# Patient Record
Sex: Male | Born: 1960 | Race: White | Hispanic: No | Marital: Married | Smoking: Current some day smoker
Health system: Southern US, Community
[De-identification: ages and names within clinical notes are randomized; demographics above are authoritative.]

## PROBLEM LIST (undated history)

## (undated) DIAGNOSIS — F419 Anxiety disorder, unspecified: Secondary | ICD-10-CM

## (undated) DIAGNOSIS — T7840XA Allergy, unspecified, initial encounter: Secondary | ICD-10-CM

## (undated) DIAGNOSIS — E785 Hyperlipidemia, unspecified: Secondary | ICD-10-CM

## (undated) DIAGNOSIS — K219 Gastro-esophageal reflux disease without esophagitis: Secondary | ICD-10-CM

## (undated) HISTORY — PX: SINUS EXPLORATION: SHX5214

## (undated) HISTORY — DX: Allergy, unspecified, initial encounter: T78.40XA

---

## 2001-05-11 ENCOUNTER — Encounter: Payer: Self-pay | Admitting: Emergency Medicine

## 2001-05-11 ENCOUNTER — Emergency Department (HOSPITAL_COMMUNITY): Admission: EM | Admit: 2001-05-11 | Discharge: 2001-05-11 | Payer: Self-pay | Admitting: Emergency Medicine

## 2001-09-26 ENCOUNTER — Emergency Department (HOSPITAL_COMMUNITY): Admission: EM | Admit: 2001-09-26 | Discharge: 2001-09-26 | Payer: Self-pay | Admitting: Emergency Medicine

## 2001-09-30 ENCOUNTER — Emergency Department (HOSPITAL_COMMUNITY): Admission: EM | Admit: 2001-09-30 | Discharge: 2001-09-30 | Payer: Self-pay | Admitting: Emergency Medicine

## 2001-09-30 ENCOUNTER — Encounter: Payer: Self-pay | Admitting: Emergency Medicine

## 2001-10-25 ENCOUNTER — Encounter: Payer: Self-pay | Admitting: Emergency Medicine

## 2001-10-25 ENCOUNTER — Emergency Department (HOSPITAL_COMMUNITY): Admission: EM | Admit: 2001-10-25 | Discharge: 2001-10-25 | Payer: Self-pay | Admitting: Emergency Medicine

## 2002-06-21 ENCOUNTER — Emergency Department (HOSPITAL_COMMUNITY): Admission: EM | Admit: 2002-06-21 | Discharge: 2002-06-21 | Payer: Self-pay | Admitting: Emergency Medicine

## 2002-06-21 ENCOUNTER — Encounter: Payer: Self-pay | Admitting: Emergency Medicine

## 2002-12-29 ENCOUNTER — Emergency Department (HOSPITAL_COMMUNITY): Admission: EM | Admit: 2002-12-29 | Discharge: 2002-12-29 | Payer: Self-pay | Admitting: Emergency Medicine

## 2002-12-29 ENCOUNTER — Encounter: Payer: Self-pay | Admitting: Emergency Medicine

## 2003-02-20 ENCOUNTER — Emergency Department (HOSPITAL_COMMUNITY): Admission: EM | Admit: 2003-02-20 | Discharge: 2003-02-20 | Payer: Self-pay | Admitting: Unknown Physician Specialty

## 2003-03-28 ENCOUNTER — Encounter: Payer: Self-pay | Admitting: Emergency Medicine

## 2003-03-28 ENCOUNTER — Emergency Department (HOSPITAL_COMMUNITY): Admission: EM | Admit: 2003-03-28 | Discharge: 2003-03-28 | Payer: Self-pay | Admitting: Emergency Medicine

## 2003-07-06 ENCOUNTER — Emergency Department (HOSPITAL_COMMUNITY): Admission: EM | Admit: 2003-07-06 | Discharge: 2003-07-06 | Payer: Self-pay | Admitting: Emergency Medicine

## 2003-07-12 ENCOUNTER — Emergency Department (HOSPITAL_COMMUNITY): Admission: EM | Admit: 2003-07-12 | Discharge: 2003-07-12 | Payer: Self-pay | Admitting: Emergency Medicine

## 2003-07-12 ENCOUNTER — Encounter: Payer: Self-pay | Admitting: Emergency Medicine

## 2003-07-31 ENCOUNTER — Emergency Department (HOSPITAL_COMMUNITY): Admission: EM | Admit: 2003-07-31 | Discharge: 2003-07-31 | Payer: Self-pay | Admitting: Emergency Medicine

## 2004-07-21 ENCOUNTER — Emergency Department (HOSPITAL_COMMUNITY): Admission: EM | Admit: 2004-07-21 | Discharge: 2004-07-21 | Payer: Self-pay | Admitting: Emergency Medicine

## 2004-08-02 ENCOUNTER — Emergency Department (HOSPITAL_COMMUNITY): Admission: EM | Admit: 2004-08-02 | Discharge: 2004-08-02 | Payer: Self-pay | Admitting: Emergency Medicine

## 2004-08-03 ENCOUNTER — Emergency Department (HOSPITAL_COMMUNITY): Admission: EM | Admit: 2004-08-03 | Discharge: 2004-08-03 | Payer: Self-pay | Admitting: Emergency Medicine

## 2004-08-20 ENCOUNTER — Emergency Department (HOSPITAL_COMMUNITY): Admission: EM | Admit: 2004-08-20 | Discharge: 2004-08-20 | Payer: Self-pay | Admitting: Emergency Medicine

## 2005-01-09 ENCOUNTER — Emergency Department (HOSPITAL_COMMUNITY): Admission: EM | Admit: 2005-01-09 | Discharge: 2005-01-09 | Payer: Self-pay | Admitting: Emergency Medicine

## 2006-01-26 ENCOUNTER — Emergency Department (HOSPITAL_COMMUNITY): Admission: EM | Admit: 2006-01-26 | Discharge: 2006-01-26 | Payer: Self-pay | Admitting: Emergency Medicine

## 2006-08-20 ENCOUNTER — Emergency Department (HOSPITAL_COMMUNITY): Admission: EM | Admit: 2006-08-20 | Discharge: 2006-08-20 | Payer: Self-pay | Admitting: Emergency Medicine

## 2006-12-08 HISTORY — PX: NASAL SINUS SURGERY: SHX719

## 2011-01-20 ENCOUNTER — Ambulatory Visit (INDEPENDENT_AMBULATORY_CARE_PROVIDER_SITE_OTHER): Payer: BC Managed Care – PPO | Admitting: Family Medicine

## 2011-01-20 DIAGNOSIS — J309 Allergic rhinitis, unspecified: Secondary | ICD-10-CM

## 2011-01-20 DIAGNOSIS — M549 Dorsalgia, unspecified: Secondary | ICD-10-CM

## 2011-01-20 DIAGNOSIS — M79609 Pain in unspecified limb: Secondary | ICD-10-CM

## 2011-01-20 DIAGNOSIS — Z0000 Encounter for general adult medical examination without abnormal findings: Secondary | ICD-10-CM

## 2011-02-18 ENCOUNTER — Ambulatory Visit: Payer: BC Managed Care – PPO | Admitting: Family Medicine

## 2011-05-13 ENCOUNTER — Other Ambulatory Visit: Payer: Self-pay | Admitting: Internal Medicine

## 2011-05-22 ENCOUNTER — Other Ambulatory Visit: Payer: BC Managed Care – PPO

## 2012-03-29 ENCOUNTER — Emergency Department (HOSPITAL_COMMUNITY)
Admission: EM | Admit: 2012-03-29 | Discharge: 2012-03-29 | Disposition: A | Discharge status: Home / Self Care | Payer: No Typology Code available for payment source | Attending: Emergency Medicine | Admitting: Emergency Medicine

## 2012-03-29 ENCOUNTER — Encounter (HOSPITAL_COMMUNITY): Payer: Self-pay

## 2012-03-29 DIAGNOSIS — T148XXA Other injury of unspecified body region, initial encounter: Secondary | ICD-10-CM

## 2012-03-29 DIAGNOSIS — R079 Chest pain, unspecified: Secondary | ICD-10-CM | POA: Insufficient documentation

## 2012-03-29 DIAGNOSIS — M542 Cervicalgia: Secondary | ICD-10-CM | POA: Insufficient documentation
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
## 2012-03-30 ENCOUNTER — Encounter: Payer: Self-pay | Admitting: *Deleted

## 2012-03-31 ENCOUNTER — Ambulatory Visit: Payer: BC Managed Care – PPO

## 2012-03-31 ENCOUNTER — Ambulatory Visit (INDEPENDENT_AMBULATORY_CARE_PROVIDER_SITE_OTHER): Payer: BC Managed Care – PPO | Admitting: Family Medicine

## 2012-03-31 DIAGNOSIS — R0789 Other chest pain: Secondary | ICD-10-CM

## 2012-03-31 DIAGNOSIS — S139XXA Sprain of joints and ligaments of unspecified parts of neck, initial encounter: Secondary | ICD-10-CM

## 2012-03-31 DIAGNOSIS — R071 Chest pain on breathing: Secondary | ICD-10-CM

## 2012-03-31 DIAGNOSIS — S161XXA Strain of muscle, fascia and tendon at neck level, initial encounter: Secondary | ICD-10-CM

## 2012-03-31 MED ORDER — CYCLOBENZAPRINE HCL 5 MG PO TABS: 5.0000 mg | ORAL_TABLET | Freq: Three times a day (TID) | ORAL | Status: AC | PRN

## 2012-03-31 NOTE — ED Provider Notes (Signed)
History/physical exam/procedure(s) were performed by non-physician practitioner and as supervising physician I was immediately available for consultation/collaboration. I have reviewed all notes and am in agreement with care and plan.   Keean Wilmeth S Ashelynn Marks, MD 03/31/12 1540 
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
## 2012-04-01 ENCOUNTER — Telehealth: Payer: Self-pay

## 2012-04-01 NOTE — Telephone Encounter (Signed)
Per Dr. Hal Hope, ok for light duty note. Please fill out.  Cody Quinn

## 2012-04-01 NOTE — Telephone Encounter (Signed)
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  But would like to know if he can get out of  work note for 4/29 until 5/5 with restrictions. Was seen yesturday and forgot to get a work note.

## 2012-04-01 NOTE — Telephone Encounter (Signed)
.  [facility] The patient's wife called regarding work note for light duty for date range of 04/05/12 through 04/11/12.  Please call patient at [phone] or patient's wife Sam Overbeck at [phone].  The patient was seen on 03/31/12 by Dr. Hal Hope.

## 2012-04-01 NOTE — Telephone Encounter (Signed)
Spoke w/pt and wife to verify what work pt normally does and details of dates needed. Per Dr Hal Hope wrote and faxed Return to Work Eval form for pt which Dr Hal Hope reviewed. Received confirmation fax went through. Mailed pt copy of note per request.

## 2012-04-04 ENCOUNTER — Telehealth: Payer: Self-pay

## 2012-04-04 NOTE — Telephone Encounter (Signed)
Patient called again and wants someone to please call her!  Needs a note for work for husband tomorrow so he wont get fired.  Very upset, not understanding and perhaps confused.

## 2012-04-04 NOTE — Telephone Encounter (Signed)
SPOUSE CALLED VERY UPSET.  SHE STATES THAT RECORDS HAVE NOT BEEN RECEIVED FROM VISIT WITH DR RICHTER ON 03/31/12.  NEEDS RECORDS FAXED TO [phone] ATTN:SARAH OR LEON.  WIFE WAS IMPAIRED AND ADAMANT THAT HE BE ON LIGHT DUTY AND NO HEAVY LIFTING.  REPEATEDLY TOLD EVENTS AND STATED SHE WANTED DR. RICHTER TO CALL HER TONIGHT.  EXPLAINED DR. RICHTER NOT IN UNTIL TOMORROW, BUT THAT I WOULD SUBMIT MESSAGE.
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
## 2012-04-04 NOTE — Telephone Encounter (Signed)
Patient evaluated Wednesday and called back the following day requesting a note for light duty.  [employer] completed light duty note and submitted to patient's employer. Per Barbara's documentation fax confirmation was received and is on file.

## 2012-04-05 ENCOUNTER — Emergency Department (HOSPITAL_COMMUNITY): Payer: No Typology Code available for payment source

## 2012-04-05 ENCOUNTER — Emergency Department (HOSPITAL_COMMUNITY)
Admission: EM | Admit: 2012-04-05 | Discharge: 2012-04-05 | Disposition: A | Discharge status: Home / Self Care | Payer: No Typology Code available for payment source | Attending: Emergency Medicine | Admitting: Emergency Medicine

## 2012-04-05 ENCOUNTER — Encounter (HOSPITAL_COMMUNITY): Payer: Self-pay | Admitting: Emergency Medicine

## 2012-04-05 DIAGNOSIS — M542 Cervicalgia: Secondary | ICD-10-CM | POA: Insufficient documentation

## 2012-04-05 DIAGNOSIS — R51 Headache: Secondary | ICD-10-CM | POA: Insufficient documentation

## 2012-04-05 DIAGNOSIS — F411 Generalized anxiety disorder: Secondary | ICD-10-CM | POA: Insufficient documentation

## 2012-04-05 DIAGNOSIS — M79609 Pain in unspecified limb: Secondary | ICD-10-CM | POA: Insufficient documentation

## 2012-04-05 DIAGNOSIS — K219 Gastro-esophageal reflux disease without esophagitis: Secondary | ICD-10-CM | POA: Insufficient documentation

## 2012-04-05 DIAGNOSIS — E785 Hyperlipidemia, unspecified: Secondary | ICD-10-CM | POA: Insufficient documentation

## 2012-04-05 HISTORY — DX: Hyperlipidemia, unspecified: E78.5

## 2012-04-05 HISTORY — DX: Anxiety disorder, unspecified: F41.9

## 2012-04-05 HISTORY — DX: Gastro-esophageal reflux disease without esophagitis: K21.9
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
## 2012-04-06 NOTE — ED Provider Notes (Signed)
Medical screening examination/treatment/procedure(s) were performed by non-physician practitioner and as supervising physician I was immediately available for consultation/collaboration.   Saory Carriero, MD 04/06/12 0011 

## 2012-04-07 DIAGNOSIS — Z0271 Encounter for disability determination: Secondary | ICD-10-CM

## 2012-04-10 ENCOUNTER — Ambulatory Visit (INDEPENDENT_AMBULATORY_CARE_PROVIDER_SITE_OTHER): Payer: BC Managed Care – PPO | Admitting: Family Medicine

## 2012-04-10 VITALS — BP 113/76 | HR 57 | Temp 98.4°F | Resp 16 | Ht 66.75 in | Wt 164.2 lb

## 2012-04-10 DIAGNOSIS — J329 Chronic sinusitis, unspecified: Secondary | ICD-10-CM

## 2012-04-10 DIAGNOSIS — IMO0001 Reserved for inherently not codable concepts without codable children: Secondary | ICD-10-CM

## 2012-04-10 DIAGNOSIS — J309 Allergic rhinitis, unspecified: Secondary | ICD-10-CM

## 2012-04-10 DIAGNOSIS — M62838 Other muscle spasm: Secondary | ICD-10-CM

## 2012-04-10 MED ORDER — AZITHROMYCIN 250 MG PO TABS: ORAL_TABLET | ORAL | Status: AC
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
## 2012-04-12 ENCOUNTER — Telehealth: Payer: Self-pay

## 2012-04-12 LAB — RUBEOLA ANTIBODY IGG: Rubeola IgG: 0.47 {ISR}

## 2012-04-12 MED ORDER — AZELASTINE HCL 0.1 % NA SOLN: 1.0000 | Freq: Two times a day (BID) | NASAL | Status: DC

## 2012-04-12 NOTE — Telephone Encounter (Signed)
Astelin NS prescribed. Patient is not immune to measles. Please ask him to RTC for MMR vaccination.

## 2012-04-12 NOTE — Telephone Encounter (Signed)
Spoke with patient and informed Astelin Rx sent to pharmacy.  Also, not immune to Measles.  Please come in for MMR vaccine.  Patient concerned exposed to measles in waiting room of hospital.  Assured patient that he would be showing signs by now (per Dr Hal Hope) but if concerned symptoms are high fever, red spots.  Patient says not showing these signs.  Patient will come in for immunization in next few days.

## 2012-04-12 NOTE — Progress Notes (Signed)
Addended by: Dois Davenport on: 04/12/2012 04:54 PM   Modules accepted: Orders

## 2012-04-12 NOTE — Telephone Encounter (Signed)
Pt called from pharmacy and stated that only the Z pack is there. He was pretty certain that Dr Hal Hope had said she was going to RX another nasal spray to use instead of or in addition to his Flonase. He thinks the name started w/an "A". Dr Hal Hope, did you want to Rx for pt? Pt states he uses [facility] on [address]

## 2012-04-12 NOTE — Telephone Encounter (Signed)
Dr. Hal Hope. Can you also review the pt's labs. Thanks

## 2012-04-13 ENCOUNTER — Ambulatory Visit (INDEPENDENT_AMBULATORY_CARE_PROVIDER_SITE_OTHER): Payer: BC Managed Care – PPO | Admitting: Physician Assistant

## 2012-04-13 VITALS — BP 116/70 | HR 65 | Temp 98.2°F | Resp 16

## 2012-04-13 DIAGNOSIS — Z23 Encounter for immunization: Secondary | ICD-10-CM

## 2012-04-13 NOTE — Progress Notes (Signed)
Cody Quinn comes in today for MMR after having negative titer 04/10/12. Ok to give this today

## 2012-04-21 ENCOUNTER — Other Ambulatory Visit: Payer: Self-pay | Admitting: Internal Medicine

## 2012-04-21 DIAGNOSIS — R196 Halitosis: Secondary | ICD-10-CM

## 2012-04-30 ENCOUNTER — Ambulatory Visit
Admission: RE | Admit: 2012-04-30 | Discharge: 2012-04-30 | Disposition: A | Discharge status: Home / Self Care | Payer: BC Managed Care – PPO | Source: Ambulatory Visit | Attending: Internal Medicine | Admitting: Internal Medicine

## 2012-04-30 DIAGNOSIS — R196 Halitosis: Secondary | ICD-10-CM

## 2012-06-07 DIAGNOSIS — Z0271 Encounter for disability determination: Secondary | ICD-10-CM

## 2012-06-17 ENCOUNTER — Telehealth: Payer: Self-pay

## 2012-06-17 ENCOUNTER — Ambulatory Visit (INDEPENDENT_AMBULATORY_CARE_PROVIDER_SITE_OTHER): Payer: BC Managed Care – PPO | Admitting: Internal Medicine

## 2012-06-17 DIAGNOSIS — Z043 Encounter for examination and observation following other accident: Secondary | ICD-10-CM

## 2012-06-17 DIAGNOSIS — M545 Low back pain: Secondary | ICD-10-CM

## 2012-06-17 MED ORDER — METHYLPREDNISOLONE ACETATE 80 MG/ML IJ SUSP
80.0000 mg | Freq: Once | INTRAMUSCULAR | Status: AC
  Administered 2012-06-17: 80 mg via INTRAMUSCULAR

## 2012-06-17 NOTE — Telephone Encounter (Signed)
Dr Perrin Maltese, Medical Records do not have a disc for this patient.
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
## 2012-06-25 ENCOUNTER — Ambulatory Visit (INDEPENDENT_AMBULATORY_CARE_PROVIDER_SITE_OTHER): Payer: BC Managed Care – PPO | Admitting: Internal Medicine

## 2012-06-25 DIAGNOSIS — M545 Low back pain, unspecified: Secondary | ICD-10-CM

## 2012-06-25 DIAGNOSIS — S335XXA Sprain of ligaments of lumbar spine, initial encounter: Secondary | ICD-10-CM

## 2012-06-25 DIAGNOSIS — S39012A Strain of muscle, fascia and tendon of lower back, initial encounter: Secondary | ICD-10-CM
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
## 2012-07-01 ENCOUNTER — Encounter: Payer: Self-pay | Admitting: Internal Medicine

## 2012-09-16 ENCOUNTER — Encounter: Payer: Self-pay | Admitting: *Deleted

## 2012-11-01 ENCOUNTER — Ambulatory Visit
Admission: RE | Admit: 2012-11-01 | Discharge: 2012-11-01 | Disposition: A | Discharge status: Home / Self Care | Payer: BC Managed Care – PPO | Source: Ambulatory Visit | Attending: Internal Medicine | Admitting: Internal Medicine

## 2012-11-01 ENCOUNTER — Other Ambulatory Visit: Payer: Self-pay | Admitting: Internal Medicine

## 2012-11-01 DIAGNOSIS — R140 Abdominal distension (gaseous): Secondary | ICD-10-CM

## 2012-11-01 DIAGNOSIS — R109 Unspecified abdominal pain: Secondary | ICD-10-CM

## 2013-01-08 DIAGNOSIS — Z0271 Encounter for disability determination: Secondary | ICD-10-CM

## 2013-01-25 ENCOUNTER — Other Ambulatory Visit: Payer: Self-pay | Admitting: Gastroenterology

## 2013-01-25 DIAGNOSIS — R109 Unspecified abdominal pain: Secondary | ICD-10-CM

## 2013-01-26 ENCOUNTER — Other Ambulatory Visit: Payer: BC Managed Care – PPO
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
## 2014-01-12 ENCOUNTER — Telehealth: Payer: Self-pay

## 2014-01-12 ENCOUNTER — Encounter: Payer: Self-pay | Admitting: *Deleted

## 2014-01-12 NOTE — Telephone Encounter (Signed)
Mailed copy of MMR  Vaccine documentation to patient,.

## 2014-01-12 NOTE — Telephone Encounter (Signed)
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 PLEASE CALL [phone]

## 2014-12-19 ENCOUNTER — Ambulatory Visit: Payer: BC Managed Care – PPO | Admitting: Podiatry

## 2014-12-26 ENCOUNTER — Ambulatory Visit (INDEPENDENT_AMBULATORY_CARE_PROVIDER_SITE_OTHER): Payer: PRIVATE HEALTH INSURANCE | Admitting: Podiatry

## 2014-12-26 ENCOUNTER — Encounter: Payer: Self-pay | Admitting: Podiatry

## 2014-12-26 ENCOUNTER — Ambulatory Visit (INDEPENDENT_AMBULATORY_CARE_PROVIDER_SITE_OTHER): Payer: PRIVATE HEALTH INSURANCE

## 2014-12-26 VITALS — BP 113/74 | HR 60 | Resp 12

## 2014-12-26 DIAGNOSIS — R52 Pain, unspecified: Secondary | ICD-10-CM

## 2014-12-26 DIAGNOSIS — M722 Plantar fascial fibromatosis: Secondary | ICD-10-CM

## 2014-12-26 NOTE — Progress Notes (Signed)
   Subjective:    Patient ID: Cody Quinn, male    DOB: March 07, 1961, 54 y.o.   MRN: [id number]  HPI PT STATED RT ARCH AND BALL OF THE FOOT HAVE KNOT AND BEEN PAINFUL FOR 3 MONTHS. THE FOOT IS GETTING WORSE AND GET AGGRAVATED BY WALKING BAREFOOT. TRIED SOAK WITH EPSON SALT, HEAT BUT HELP SOME.   Review of Systems  Constitutional: Positive for activity change.  HENT: Positive for ear pain, sinus pressure and sore throat.   Cardiovascular: Positive for leg swelling.  Musculoskeletal: Positive for myalgias, back pain, joint swelling and gait problem.  All other systems reviewed and are negative.      Objective:   Physical Exam: I have reviewed his past medical history medications allergy surgery social history and review of systems. Pulses are strongly palpable bilateral. Neurologic sensorium is intact percent as well as the monofilament. Deep tendon reflexes are intact bilateral and muscle strength is 5 over 5 dorsiflexion plantar flexors inverters and everters all digits of musculature is intact. Orthopedic evaluation of his straight all joints distal to the ankle have a full range of motion without crepitus. A firm nonpulsatile nodule is noted to the plantar medial band of the plantar fascia and the central medial longitudinal arch. Radiographically this is not visible. There is no signs of calcification. He does however demonstrate osteoarthritis of the first metatarsophalangeal joint with dorsal spurring and joint space narrowing.        Assessment & Plan:  Assessment: Plantar fibroma right medial longitudinal arch.  Plan: Injected the area today with Kenalog and local anesthetic and I will follow-up with him in 2 months.

## 2015-03-01 ENCOUNTER — Ambulatory Visit: Payer: PRIVATE HEALTH INSURANCE | Admitting: Podiatry

## 2015-03-29 ENCOUNTER — Ambulatory Visit (INDEPENDENT_AMBULATORY_CARE_PROVIDER_SITE_OTHER): Payer: PRIVATE HEALTH INSURANCE | Admitting: Podiatry

## 2015-03-29 ENCOUNTER — Encounter: Payer: Self-pay | Admitting: Podiatry

## 2015-03-29 VITALS — BP 113/59 | HR 61 | Resp 16

## 2015-03-29 DIAGNOSIS — M205X1 Other deformities of toe(s) (acquired), right foot: Secondary | ICD-10-CM

## 2015-03-29 DIAGNOSIS — L608 Other nail disorders: Secondary | ICD-10-CM | POA: Diagnosis not present

## 2015-03-29 DIAGNOSIS — L603 Nail dystrophy: Secondary | ICD-10-CM

## 2015-03-29 MED ORDER — DICLOFENAC SODIUM 1 % TD GEL: 4.0000 g | Freq: Four times a day (QID) | TRANSDERMAL | Status: DC

## 2015-03-29 NOTE — Progress Notes (Signed)
He presents today for follow-up of his first metatarsophalangeal joint capsulitis and osteoarthritis of the first metatarsophalangeal joint as well as the second toe right foot. He states that the last injection really didn't seem to help too much that he finds relief from Aleve arthritis.  Objective: Vital signs are stable alert and oriented 3. Pulses are palpable pain on palpation metatarsophalangeal joint and range of motion first metatarsophalangeal joint. He also has thick dystrophic possibly mycotic nails.  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  Plan: Symptoms of the nail were taken today for pathologic evaluation and we discussed the need for surgical intervention regarding the right foot. We prescribed diclofenac gel.

## 2015-05-02 ENCOUNTER — Telehealth: Payer: Self-pay | Admitting: *Deleted

## 2015-05-02 NOTE — Telephone Encounter (Addendum)
Left message requesting call back to inform of test results.  Dr. Al CorpusHyatt states 03/29/2015 fungal culture is negative, pt may choose to treat toenails with Nuvail, if so prescribe as use as directed and refill prn 1 year.  Pt's wife, Lupita LeashDonna called stating the lab results had not been call to them.  I left her a message to have her husband call our office for results.  I informed pt's wife the toenails fungal culture is negative and offered Nuvail.  Pt 's wife states order the NuVail.  Done.

## 2015-05-03 MED ORDER — NUVAIL EX SOLN: 1.0000 [drp] | Freq: Every day | CUTANEOUS | Status: DC

## 2015-05-10 ENCOUNTER — Institutional Professional Consult (permissible substitution): Payer: Self-pay | Admitting: Pulmonary Disease

## 2015-05-31 ENCOUNTER — Telehealth: Payer: Self-pay | Admitting: *Deleted

## 2015-05-31 NOTE — Telephone Encounter (Addendum)
[facility] faxed request for prior authorization for NuVail 16% solution.  I called pt's insurance [id number] and was transferred to pharmacy department, left a message with pt information and [facility] office contact and fax information.  Pt's insurance will not cover Nuvail, and representative is discussing with the pharmacist a covered alternative for treatment of toenail deformity WITHOUT FUNGAL INFECTION.  Pharmacist states Ciclopirox was a covered product.  Bako fungal results were negative for fungus.  Dr. Al Corpus states if not fungal then there is no alternative medication.  Left message requesting a return call.  Left message requesting a return call about medication.  Left message requesting callback about medications.

## 2015-06-07 NOTE — Telephone Encounter (Signed)
Not fungal. If not covered no alternative.

## 2015-06-28 NOTE — Telephone Encounter (Signed)
Pt's wife, Lupita Leash states pt's plantar fasciitis isn't in the arch anymore , but more in the heel, Aleve caused GI BLEEDING, and the orthotics from the [employer] aren't helping and cost over $400.00. I encouraged Lupita Leash to have pt make an appt, to discuss other therapies including injections, plantar fascial brace, and rx orthotics which are often covered by most insurances for varying amounts. Lupita Leash states she will discuss with pt and call for an appt, if he agrees.  Lupita Leash states pt is using FungiNail and seeing improvement.

## 2015-09-13 ENCOUNTER — Ambulatory Visit (INDEPENDENT_AMBULATORY_CARE_PROVIDER_SITE_OTHER): Payer: PRIVATE HEALTH INSURANCE | Admitting: Podiatry

## 2015-09-13 ENCOUNTER — Encounter: Payer: Self-pay | Admitting: Podiatry

## 2015-09-13 VITALS — BP 105/66 | HR 60 | Resp 16

## 2015-09-13 DIAGNOSIS — M722 Plantar fascial fibromatosis: Secondary | ICD-10-CM

## 2015-09-13 DIAGNOSIS — M205X1 Other deformities of toe(s) (acquired), right foot: Secondary | ICD-10-CM

## 2015-09-13 DIAGNOSIS — M7751 Other enthesopathy of right foot: Secondary | ICD-10-CM | POA: Diagnosis not present

## 2015-09-13 MED ORDER — DICLOFENAC SODIUM 1 % TD GEL: 4.0000 g | Freq: Four times a day (QID) | TRANSDERMAL | Status: AC

## 2015-09-13 NOTE — Progress Notes (Signed)
He presents today with his wife chief complaint of pain to his right ankle which we have discussed in the past. Also complaining about pain about the first and second metatarsophalangeal joint as well as the right heel. All these we have had discussions on in the past. She also states that the diclofenac gel was working very well Her refill on that.  Objective: Vital signs stable alert and oriented 3. Pulses are strongly palpable. Pain on palpation medial calcaneal tubercle of the right heel. Some tenderness on range of motion and palpation of the ankle joint itself very nonspecific no swelling no edema no erythema cellulitis drainage or odor. It is not warm to the touch. He has osteoarthritic changes of the head of the first metatarsophalangeal joint and second digit right foot. Consistent with hallux limitus and hammertoe deformity with arthritis area  Assessment: Capsulitis of the right ankle plantar fasciitis of the right heel. 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  Plan: Discussed etiology pathology conservative versus surgical therapies placed him in a Tri-Lock brace injected his right heel today with Kenalog and local anesthesia and refilled the prescription for diclofenac gel.  Arbutus Ped DPM

## 2015-11-08 ENCOUNTER — Ambulatory Visit: Payer: PRIVATE HEALTH INSURANCE | Admitting: Podiatry

## 2015-11-15 ENCOUNTER — Ambulatory Visit: Payer: PRIVATE HEALTH INSURANCE | Admitting: Podiatry

## 2015-11-27 ENCOUNTER — Encounter: Payer: Self-pay | Admitting: Podiatry

## 2015-11-27 ENCOUNTER — Ambulatory Visit (INDEPENDENT_AMBULATORY_CARE_PROVIDER_SITE_OTHER): Payer: PRIVATE HEALTH INSURANCE | Admitting: Podiatry

## 2015-11-27 VITALS — BP 115/78 | HR 60 | Resp 12

## 2015-11-27 DIAGNOSIS — M7751 Other enthesopathy of right foot: Secondary | ICD-10-CM

## 2015-11-27 DIAGNOSIS — M722 Plantar fascial fibromatosis: Secondary | ICD-10-CM | POA: Diagnosis not present

## 2015-11-27 NOTE — Progress Notes (Signed)
He presents again today for follow-up of his right heel pain and pain to the first metatarsophalangeal joint. He states that he really has not resolved much at all. He continues to take pain medication provided by his chronic pain doctor.  Objective: Vital signs are stable he is alert and oriented 3. Pulses are palpable. His pain on palpation and range of motion of the first metatarsophalangeal joint of the right foot and some decreased in pain on palpation of the right heel at the plantar fascial calcaneal insertion site.  Assessment: Plantar fasciitis right heel. 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  Plan: Discussed etiology and pathology conservative versus surgical therapies. We discussed the need for surgery first metatarsophalangeal joint and an endoscopic fasciotomy of the right heel. At this point we will perform a surgical consult on his next visit. I reinjected his right heel today with Kenalog and local anesthetic and dexamethasone to the first metatarsophalangeal joint 2 mg. This is after sterile Betadine skin prep. Follow-up with him in the next few weeks for surgical consult. We will make sure to contact his pain management doctor for change in medication.

## 2015-11-29 ENCOUNTER — Ambulatory Visit: Payer: PRIVATE HEALTH INSURANCE | Admitting: Podiatry

## 2015-12-25 ENCOUNTER — Encounter: Payer: Self-pay | Admitting: Podiatry

## 2015-12-25 ENCOUNTER — Ambulatory Visit (INDEPENDENT_AMBULATORY_CARE_PROVIDER_SITE_OTHER): Payer: PRIVATE HEALTH INSURANCE | Admitting: Podiatry

## 2015-12-25 DIAGNOSIS — M722 Plantar fascial fibromatosis: Secondary | ICD-10-CM | POA: Diagnosis not present

## 2015-12-25 DIAGNOSIS — M7751 Other enthesopathy of right foot: Secondary | ICD-10-CM

## 2015-12-25 DIAGNOSIS — M205X1 Other deformities of toe(s) (acquired), right foot: Secondary | ICD-10-CM

## 2015-12-25 NOTE — Patient Instructions (Signed)
Pre-Operative Instructions  Congratulations, you have decided to take an important step to improving your quality of life.  You can be assured that the doctors of [facility] will be with you every step of the way.  1. Plan to be at the surgery center/hospital at least 1 (one) hour prior to your scheduled time unless otherwise directed by the surgical center/hospital staff.  You must have a responsible adult accompany you, remain during the surgery and drive you home.  Make sure you have directions to the surgical center/hospital and know how to get there on time. 2. For hospital based surgery you will need to obtain a history and physical form from your family physician within 1 month prior to the date of surgery- we will give you a form for you primary physician.  3. We make every effort to accommodate the date you request for surgery.  There are however, times where surgery dates or times have to be moved.  We will contact you as soon as possible if a change in schedule is required.   4. No Aspirin/Ibuprofen for one week before surgery.  If you are on aspirin, any non-steroidal anti-inflammatory medications (Mobic, Aleve, Ibuprofen) you should stop taking it 7 days prior to your surgery.  You make take Tylenol  For pain prior to surgery.  5. Medications- If you are taking daily heart and blood pressure medications, seizure, reflux, allergy, asthma, anxiety, pain or diabetes medications, make sure the surgery center/hospital is aware before the day of surgery so they may notify you which medications to take or avoid the day of surgery. 6. No food or drink after midnight the night before surgery unless directed otherwise by surgical center/hospital staff. 7. No alcoholic beverages 24 hours prior to surgery.  No smoking 24 hours prior to or 24 hours after surgery. 8. Wear loose pants or shorts- loose enough to fit over bandages, boots, and casts. 9. No slip on shoes, sneakers are best. 10. Bring  your boot with you to the surgery center/hospital.  Also bring crutches or a walker if your physician has prescribed it for you.  If you do not have this equipment, it will be provided for you after surgery. 11. If you have not been contracted by the surgery center/hospital by the day before your surgery, call to confirm the date and time of your surgery. 12. Leave-time from work may vary depending on the type of surgery you have.  Appropriate arrangements should be made prior to surgery with your employer. 13. Prescriptions will be provided immediately following surgery by your doctor.  Have these filled as soon as possible after surgery and take the medication as directed. 14. Remove nail polish on the operative foot. 15. Wash the night before surgery.  The night before surgery wash the foot and leg well with the antibacterial soap provided and water paying special attention to beneath the toenails and in between the toes.  Rinse thoroughly with water and dry well with a towel.  Perform this wash unless told not to do so by your physician.  Enclosed: 1 Ice pack (please put in freezer the night before surgery)   1 Hibiclens skin cleaner   Pre-op Instructions  If you have any questions regarding the instructions, do not hesitate to call our office.  [place]: [address] [place], [place] [address] [phone]  [place]: [address] [place], [place] [address] [phone]  [place]: [address]  [place], [place] [address] [phone]  Dr. Richard   Tuchman DPM, Dr. Norman Regal DPM Dr. Richard Sikora DPM, Dr. M. Todd Hyatt DPM, Dr. Kathryn Egerton DPM 

## 2015-12-25 NOTE — Progress Notes (Signed)
He presents today with his wife with a chief complaint of chronic pain in his right heel and first metatarsophalangeal joint. He states that really just does not seem to be getting any better and he would like to entertain a surgical correction. I discussed with him in great detail today his past medical history medications allergy surgeries and social history. Currently he is being treated by his primary care provider with Narco for chronic back pain. Otherwise he is in good health with no complications.  Objective: Vital signs are stable he is alert and oriented 3. I have reviewed his past medical history medications allergy surgeries. Allergies consist of codeine and Lipitor. Pulses are strongly palpable bilaterally neurologic sensorium is intact. He has pain on palpation chronic in nature right heel. This is noted to be at the plantar fascial calcaneal insertion site. He also has limitation in range of motion of the first metatarsophalangeal joint right foot. We have discussed this in great detail in the past and he knows that this is hallux limitus with osteoarthritic changes. The first metatarsophalangeal joint is mildly swollen and painful on end range of motion.  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 Chronic proximal plantar fasciitis right foot.  Plan: We discussed the etiology pathology conservative versus surgical therapies. At this point we have decided on a surgical consult today for an endoscopic plantar fasciotomy right foot and a Keller arthroplasty with a single silicone implant right foot. I answered all the questions regarding these procedures to the best of my ability in layman's terms. He understands this is amenable to it and signed all 3 pages of the consent form. We discussed the possible postoperative medications which may include but are not limited to postop pain bleeding swelling infection  recurrence need for further surgery loss of digit loss of limb loss of life. We also discussed the possible complication of rejection of the artificial joint. We also discussed possibility of development of a DVT or per pulmonary emboli. We dispensed a cam walker today for his postop recovery period. I will follow-up with him in the near future for surgical intervention.

## 2016-01-30 ENCOUNTER — Other Ambulatory Visit: Payer: Self-pay | Admitting: Podiatry

## 2016-01-30 MED ORDER — PROMETHAZINE HCL 25 MG PO TABS: 25.0000 mg | ORAL_TABLET | Freq: Three times a day (TID) | ORAL | Status: DC | PRN

## 2016-01-30 MED ORDER — OXYCODONE-ACETAMINOPHEN 10-325 MG PO TABS: ORAL_TABLET | ORAL | Status: AC

## 2016-01-30 MED ORDER — CEPHALEXIN 500 MG PO CAPS: 500.0000 mg | ORAL_CAPSULE | Freq: Three times a day (TID) | ORAL | Status: DC

## 2016-02-01 ENCOUNTER — Encounter: Payer: Self-pay | Admitting: Podiatry

## 2016-02-01 ENCOUNTER — Telehealth: Payer: Self-pay | Admitting: *Deleted

## 2016-02-01 DIAGNOSIS — M2011 Hallux valgus (acquired), right foot: Secondary | ICD-10-CM | POA: Diagnosis not present

## 2016-02-01 DIAGNOSIS — M722 Plantar fascial fibromatosis: Secondary | ICD-10-CM | POA: Diagnosis not present

## 2016-02-01 NOTE — Telephone Encounter (Signed)
"  I'm calling to verify that patient had surgery today."  Patient was scheduled for today.  "That's all I needed to know."

## 2016-02-07 ENCOUNTER — Ambulatory Visit: Payer: Self-pay

## 2016-02-07 ENCOUNTER — Encounter: Payer: Self-pay | Admitting: Podiatry

## 2016-02-07 DIAGNOSIS — M722 Plantar fascial fibromatosis: Secondary | ICD-10-CM

## 2016-02-07 DIAGNOSIS — Z9889 Other specified postprocedural states: Secondary | ICD-10-CM

## 2016-02-07 DIAGNOSIS — M205X1 Other deformities of toe(s) (acquired), right foot: Secondary | ICD-10-CM

## 2016-02-07 NOTE — Progress Notes (Signed)
This encounter was created in error - please disregard.

## 2016-02-08 ENCOUNTER — Telehealth: Payer: Self-pay | Admitting: Podiatry

## 2016-02-08 NOTE — Telephone Encounter (Addendum)
I gave Dr. Geryl RankinsHyatt's orders to get pt in asap to [facility].
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
## 2016-02-08 NOTE — Telephone Encounter (Signed)
I need to see this patient ASAP Next Tuesday if possible.  He cannot get this foot wet.  You need to make him very aware that he could lose his foot if he is not seen and it isn't kept elevated and dry.

## 2016-02-12 ENCOUNTER — Ambulatory Visit (INDEPENDENT_AMBULATORY_CARE_PROVIDER_SITE_OTHER): Payer: PRIVATE HEALTH INSURANCE | Admitting: Podiatry

## 2016-02-12 ENCOUNTER — Ambulatory Visit (INDEPENDENT_AMBULATORY_CARE_PROVIDER_SITE_OTHER): Payer: PRIVATE HEALTH INSURANCE

## 2016-02-12 ENCOUNTER — Encounter: Payer: Self-pay | Admitting: Podiatry

## 2016-02-12 VITALS — BP 98/56 | HR 67 | Resp 16

## 2016-02-12 DIAGNOSIS — M205X1 Other deformities of toe(s) (acquired), right foot: Secondary | ICD-10-CM | POA: Diagnosis not present

## 2016-02-12 DIAGNOSIS — M722 Plantar fascial fibromatosis: Secondary | ICD-10-CM

## 2016-02-12 DIAGNOSIS — Z9889 Other specified postprocedural states: Secondary | ICD-10-CM
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
## 2016-02-14 ENCOUNTER — Encounter: Payer: Self-pay | Admitting: Podiatry

## 2016-02-28 ENCOUNTER — Encounter: Payer: Self-pay | Admitting: Podiatry

## 2016-02-28 ENCOUNTER — Ambulatory Visit (INDEPENDENT_AMBULATORY_CARE_PROVIDER_SITE_OTHER): Payer: PRIVATE HEALTH INSURANCE

## 2016-02-28 ENCOUNTER — Ambulatory Visit (INDEPENDENT_AMBULATORY_CARE_PROVIDER_SITE_OTHER): Payer: PRIVATE HEALTH INSURANCE | Admitting: Podiatry

## 2016-02-28 ENCOUNTER — Encounter: Payer: PRIVATE HEALTH INSURANCE | Admitting: Podiatry

## 2016-02-28 VITALS — BP 113/70 | HR 86 | Resp 12

## 2016-02-28 DIAGNOSIS — M722 Plantar fascial fibromatosis: Secondary | ICD-10-CM | POA: Diagnosis not present

## 2016-02-28 DIAGNOSIS — M205X1 Other deformities of toe(s) (acquired), right foot: Secondary | ICD-10-CM | POA: Diagnosis not present

## 2016-02-28 DIAGNOSIS — Z9889 Other specified postprocedural states: Secondary | ICD-10-CM

## 2016-02-29 NOTE — Progress Notes (Signed)
He presents today one month status post Keller arthroplasty with a single silicone implant right foot and an EPF right. He states that the lateral aspect of the foot is sore. At the surgical site appears to be doing well.  Objective: Vital signs are stable he is alert and oriented 3. Pulses are palpable. Incision sites have gone on to heal uneventfully. He has no edema and no erythema cellulitis drainage or odor. Has a good range of motion of the first metatarsophalangeal joint with a slight click of the sesamoids as they cross the resection site. This is not painful to him and he is excited about a range of motion that is pain-free.  Assessment: Well-healing surgical foot.  Plan: I will allow him to start utilizing a Darco shoe and I will follow-up with him in 2 weeks.

## 2016-03-13 ENCOUNTER — Ambulatory Visit (INDEPENDENT_AMBULATORY_CARE_PROVIDER_SITE_OTHER): Payer: PRIVATE HEALTH INSURANCE

## 2016-03-13 ENCOUNTER — Ambulatory Visit (INDEPENDENT_AMBULATORY_CARE_PROVIDER_SITE_OTHER): Payer: PRIVATE HEALTH INSURANCE | Admitting: Podiatry

## 2016-03-13 ENCOUNTER — Encounter: Payer: Self-pay | Admitting: Podiatry

## 2016-03-13 VITALS — BP 120/70 | HR 64 | Resp 12

## 2016-03-13 DIAGNOSIS — Z9889 Other specified postprocedural states: Secondary | ICD-10-CM

## 2016-03-13 DIAGNOSIS — M2011 Hallux valgus (acquired), right foot: Secondary | ICD-10-CM

## 2016-03-13 DIAGNOSIS — M205X1 Other deformities of toe(s) (acquired), right foot: Secondary | ICD-10-CM

## 2016-03-13 DIAGNOSIS — M722 Plantar fascial fibromatosis: Secondary | ICD-10-CM

## 2016-03-13 NOTE — Progress Notes (Signed)
He presents today for a follow-up of a Keller arthroplasty with a single silicone implant right foot and an EPF. Date of surgery was 02/01/2016. He states that everything seems to be doing fine.  Objective: Vital signs are stable he is alert and oriented 3. He presents today ambulating with his Darco shoe that I have discussed with him in the past about getting into a regular tennis shoe. He states to me that he cannot find his other tennis shoe to wear. Foot exam does not demonstrate any major changes no erythema cellulitis drainage or odor has great range of motion of the first metatarsophalangeal joint and a well-healed incision line. I see no signs of infection he has minimal tenderness on palpation of the right heel. I think the majority of the tenderness that is present is due to wearing the Darco shoe for more than time requested.  Assessment: Well-healing surgical foot right.  Plan: Encouraged him to get back into a paratenon tissues I will follow up with him 2 weeks before being released to work.

## 2016-03-27 ENCOUNTER — Ambulatory Visit (INDEPENDENT_AMBULATORY_CARE_PROVIDER_SITE_OTHER): Payer: PRIVATE HEALTH INSURANCE

## 2016-03-27 ENCOUNTER — Encounter: Payer: Self-pay | Admitting: Podiatry

## 2016-03-27 ENCOUNTER — Ambulatory Visit (INDEPENDENT_AMBULATORY_CARE_PROVIDER_SITE_OTHER): Payer: PRIVATE HEALTH INSURANCE | Admitting: Podiatry

## 2016-03-27 VITALS — BP 109/66 | HR 72 | Resp 16

## 2016-03-27 DIAGNOSIS — M205X1 Other deformities of toe(s) (acquired), right foot: Secondary | ICD-10-CM | POA: Diagnosis not present

## 2016-03-27 DIAGNOSIS — M722 Plantar fascial fibromatosis: Secondary | ICD-10-CM

## 2016-03-27 DIAGNOSIS — Z9889 Other specified postprocedural states: Secondary | ICD-10-CM

## 2016-03-29 NOTE — Progress Notes (Signed)
He presents today for postop visit status post Keller arthroplasty with a single silicone implant right foot and a EPF right foot. He states that he is doing just fine walking in his regular shoe gear.  Objective: Vital signs are stable he is alert and oriented 3 no reproducible pain. Radiographs confirm well-healing Keller arthroplasty with a single silicone implant. No signs of infection or dislocation.  Assessment: Well-healing surgical foot.  Plan: I'm going to release him to get back to work and I will follow-up with him in 1 month if necessary.

## 2016-05-22 ENCOUNTER — Ambulatory Visit (INDEPENDENT_AMBULATORY_CARE_PROVIDER_SITE_OTHER): Payer: PRIVATE HEALTH INSURANCE

## 2016-05-22 ENCOUNTER — Ambulatory Visit (INDEPENDENT_AMBULATORY_CARE_PROVIDER_SITE_OTHER): Payer: PRIVATE HEALTH INSURANCE | Admitting: Podiatry

## 2016-05-22 ENCOUNTER — Encounter: Payer: Self-pay | Admitting: Podiatry

## 2016-05-22 DIAGNOSIS — Z9889 Other specified postprocedural states: Secondary | ICD-10-CM | POA: Diagnosis not present

## 2016-05-22 DIAGNOSIS — M722 Plantar fascial fibromatosis: Secondary | ICD-10-CM | POA: Diagnosis not present

## 2016-05-22 DIAGNOSIS — M205X1 Other deformities of toe(s) (acquired), right foot: Secondary | ICD-10-CM | POA: Diagnosis not present

## 2016-05-22 NOTE — Progress Notes (Signed)
He is status post Keller arthroplasty with single silicone implant. He states that he was doing very well until his heel started to hurt and that his forefoot [place] swell or become more painful. He states that his with his wife's recent injury he is having to do more work around the house and it worked.  Objective: Vital signs are stable alert and oriented 3. He has pain on palpation medial calcaneal tubercle of the right heel. Pulses are palpable. Good range of motion of the first metatarsophalangeal joint. No edema on physical exam. Radiographs confirm well placed Keller arthroplasty with single silicone implant.  Assessment: Plantar fasciitis more than likely resulting in some compensatory forefoot symptoms postoperatively.  Plan: I injected his right heel today with Kenalog and local anesthetic encouraged the use of tennis shoes and I will follow-up with him in 1 month.

## 2016-05-27 ENCOUNTER — Telehealth: Payer: Self-pay | Admitting: *Deleted

## 2016-05-27 MED ORDER — NONFORMULARY OR COMPOUNDED ITEM: Status: AC

## 2016-05-27 NOTE — Telephone Encounter (Addendum)
Pt states he would like the same pain cream prescribed by Dr. Al CorpusHyatt for his wife, Lupita LeashDonna. Dr. Al CorpusHyatt ordered [employer] Combination Pain Cream.  FAxed to [employer].

## 2016-05-27 NOTE — Telephone Encounter (Signed)
Yes please.  That sounds great.

## 2016-07-31 NOTE — Progress Notes (Signed)
DOS 02/01/2016 Keller Arthoplasty with single silicone implant rt, Endoscopic plantar fasciotomy rt foot.

## 2016-08-05 ENCOUNTER — Encounter: Payer: Self-pay | Admitting: Podiatry

## 2016-08-05 ENCOUNTER — Ambulatory Visit (INDEPENDENT_AMBULATORY_CARE_PROVIDER_SITE_OTHER): Payer: PRIVATE HEALTH INSURANCE | Admitting: Podiatry

## 2016-08-05 DIAGNOSIS — M722 Plantar fascial fibromatosis: Secondary | ICD-10-CM

## 2016-08-06 NOTE — Progress Notes (Signed)
He presents today status post Keller arthroplasty single silicone implant right foot which she has gone to heal from uneventfully. He still complains of some pain right beneath the proximal phalanx.  Objective evaluation reveals vital signs stable alert 3 he has tenderness on palpation at the base of the proximal phalanx first metatarsophalangeal joint right foot.  Assessment: Capsulitis tendinitis possibly sesamoiditis this area.  Plan: I injected the area today with Kenalog and local anesthetic this seemed to alleviate his symptoms almost immediately. Follow up with him as needed.

## 2016-09-11 ENCOUNTER — Ambulatory Visit: Payer: PRIVATE HEALTH INSURANCE | Admitting: Podiatry

## 2017-01-20 ENCOUNTER — Encounter: Payer: Self-pay | Admitting: Podiatry

## 2017-01-20 ENCOUNTER — Ambulatory Visit (INDEPENDENT_AMBULATORY_CARE_PROVIDER_SITE_OTHER): Payer: PRIVATE HEALTH INSURANCE | Admitting: Podiatry

## 2017-01-20 ENCOUNTER — Ambulatory Visit (HOSPITAL_BASED_OUTPATIENT_CLINIC_OR_DEPARTMENT_OTHER)
Admission: RE | Admit: 2017-01-20 | Discharge: 2017-01-20 | Disposition: A | Discharge status: Home / Self Care | Payer: 59 | Source: Ambulatory Visit | Attending: Podiatry | Admitting: Podiatry

## 2017-01-20 DIAGNOSIS — M2580 Other specified joint disorders, unspecified joint: Secondary | ICD-10-CM

## 2017-01-20 DIAGNOSIS — M205X1 Other deformities of toe(s) (acquired), right foot: Secondary | ICD-10-CM | POA: Insufficient documentation

## 2017-01-20 DIAGNOSIS — M779 Enthesopathy, unspecified: Secondary | ICD-10-CM

## 2017-01-20 DIAGNOSIS — Z96698 Presence of other orthopedic joint implants: Secondary | ICD-10-CM | POA: Insufficient documentation

## 2017-01-20 MED ORDER — TRIAMCINOLONE ACETONIDE 10 MG/ML IJ SUSP
10.0000 mg | Freq: Once | INTRAMUSCULAR | Status: AC
  Administered 2017-01-20: 10 mg

## 2017-01-20 NOTE — Progress Notes (Signed)
Subjective: 56 year old male presents the also concerns of right foot pain. He presents today with his wife who was doing all of the talking and would not allow the patient to really speak about was going on. Patients wife states "he doesn't know where it hurts he just knows it hurts". He states that after last time he saw Dr. Al CorpusHyatt he had an injection which did help is asking for another injection today. Denies any recent injury or trauma. He does work all day on concrete floors were no regular shoe which aggravates his symptoms. Denies any systemic complaints such as fevers, chills, nausea, vomiting. No acute changes since last appointment, and no other complaints at this time.   Objective: AAO x3, NAD DP/PT pulses palpable bilaterally, CRT less than 3 seconds Negative Tinel sign. There appears to be tenderness palpation along the sesamoid complex on the right foot. There is no apparent pain with MPJ range of motion although it is somewhat limited. His scar from the prior surgery is well-healed. There is no smoking edema, erythema, increase in warmth. MMT 5/5. No open lesions or pre-ulcerative lesions.  No pain with calf compression, swelling, warmth, erythema  Assessment: Sesamoiditis right foot  Plan: -All treatment options discussed with the patient including all alternatives, risks, complications.  -X-rays were obtained and reviewed with the patient. -At this time discussed the service injection is the area of tenderness and he wishes to proceed with this understanding risks and complications under sterile conditions a mixture of Kenalog and local anesthetic was infiltrated around the area of maximal tenderness on the sesamoids the right foot with care not to inject around the implant. -Long term I believe he would benefit from a custom orthotic for the first ray cutout to help take pressure off this area. He was scanned for orthotics today. Awaiting insurance approval. -RTC in 4 weeks or  sooner if needed.  -Patient encouraged to call the office with any questions, concerns, change in symptoms.   Ovid CurdMatthew Wagoner, DPM

## 2017-02-17 ENCOUNTER — Ambulatory Visit (INDEPENDENT_AMBULATORY_CARE_PROVIDER_SITE_OTHER): Payer: PRIVATE HEALTH INSURANCE | Admitting: Podiatry

## 2017-02-17 ENCOUNTER — Encounter: Payer: Self-pay | Admitting: Podiatry

## 2017-02-17 DIAGNOSIS — M779 Enthesopathy, unspecified: Secondary | ICD-10-CM | POA: Diagnosis not present

## 2017-02-17 DIAGNOSIS — M722 Plantar fascial fibromatosis: Secondary | ICD-10-CM

## 2017-02-17 DIAGNOSIS — M2580 Other specified joint disorders, unspecified joint: Secondary | ICD-10-CM

## 2017-02-17 MED ORDER — TRIAMCINOLONE ACETONIDE 10 MG/ML IJ SUSP
10.0000 mg | Freq: Once | INTRAMUSCULAR | Status: AC
  Administered 2017-02-17: 10 mg

## 2017-02-19 NOTE — Progress Notes (Signed)
Subjective: 56 year old male presents the also concerns of right foot pain.  He states that the injection did help a lot and he is requesting a new orthotic today. He was already measured last appointment but would like to go ahead and proceed with them. He works on concrete floors all day and he thinks this is what is aggravating his symptoms. . Denies any systemic complaints such as fevers, chills, nausea, vomiting. No acute changes since last appointment, and no other complaints at this time.   Objective: AAO x3, NAD DP/PT pulses palpable bilaterally, CRT less than 3 seconds Negative Tinel sign. There appears to be improved yet continued tenderness to  palpation along the sesamoid complex on the right foot. There is also mild tenderness just proximal to the sesamoids along the distal portion of the plantar fascia. There is no apparent pain with MPJ range of motion although it is somewhat limited. His scar from the prior surgery is well-healed. There is no smoking edema, erythema, increase in warmth. MMT 5/5. No open lesions or pre-ulcerative lesions.  No pain with calf compression, swelling, warmth, erythema  Assessment: Sesamoiditis right foot  Plan: -All treatment options discussed with the patient including all alternatives, risks, complications.  -At this time discussed  a second steroid injection is the area of tenderness and he wishes to proceed with this understanding risks and complications under sterile conditions a mixture of Kenalog and local anesthetic was infiltrated around the area of maximal tenderness on the sesamoids the right foot with care not to inject around the implant. This injection was done more proximal to sesamoid along the distal portion of the plantar fascia.  -Orthotics ordered today. Discussed the price of them and they are away and they state "Our insurance is going to pay. I know it. I called and spoke to them this morning".  -RTC once inserts arrive or sooner  if needed.  -Patient encouraged to call the office with any questions, concerns, change in symptoms.   Ovid CurdMatthew Wagoner, DPM

## 2017-03-03 ENCOUNTER — Telehealth: Payer: Self-pay | Admitting: *Deleted

## 2017-03-03 NOTE — Telephone Encounter (Signed)
The patient's inserts [facility] and was dispensed today with instructions and how to wear the inserts properly and if any concerns or questions to call the office. Cody StanleyLisa

## 2017-03-03 NOTE — Patient Instructions (Signed)

## 2017-04-23 ENCOUNTER — Ambulatory Visit: Payer: PRIVATE HEALTH INSURANCE | Admitting: Podiatry

## 2017-08-11 ENCOUNTER — Ambulatory Visit (INDEPENDENT_AMBULATORY_CARE_PROVIDER_SITE_OTHER): Payer: PRIVATE HEALTH INSURANCE | Admitting: Podiatry

## 2017-08-11 ENCOUNTER — Encounter: Payer: Self-pay | Admitting: Podiatry

## 2017-08-11 ENCOUNTER — Ambulatory Visit (HOSPITAL_BASED_OUTPATIENT_CLINIC_OR_DEPARTMENT_OTHER)
Admission: RE | Admit: 2017-08-11 | Discharge: 2017-08-11 | Disposition: A | Discharge status: Home / Self Care | Payer: 59 | Source: Ambulatory Visit | Attending: Podiatry | Admitting: Podiatry

## 2017-08-11 DIAGNOSIS — M779 Enthesopathy, unspecified: Secondary | ICD-10-CM | POA: Insufficient documentation

## 2017-08-11 DIAGNOSIS — M722 Plantar fascial fibromatosis: Secondary | ICD-10-CM | POA: Diagnosis not present

## 2017-08-11 DIAGNOSIS — G8929 Other chronic pain: Secondary | ICD-10-CM | POA: Insufficient documentation

## 2017-08-11 DIAGNOSIS — M79671 Pain in right foot: Secondary | ICD-10-CM | POA: Insufficient documentation

## 2017-08-11 DIAGNOSIS — M2041 Other hammer toe(s) (acquired), right foot: Secondary | ICD-10-CM | POA: Diagnosis not present

## 2017-08-11 NOTE — Progress Notes (Signed)
Subjective: Cody Quinn, who presents today with his wife, presents a for concerns of continued right foot pain which has been ongoing since the surgery he had. He states he has new concerns today as well as the second toe started to contract and very painful to pressure in shoes. He denies any recent injury or trauma to the toe. Denies any swelling or redness but is painful with his shoes. He also gets pain on the plantar aspect of the foot. He points just proximal to the sesamoid complex. He states the injection he had last limited help for some time but the pain did recur. He states the orthotics also helped quite a bit. He has not been wearing them on a continual basis. He also states that he has had ongoing pain to his right ankle he has torn ankle brace to work. He is unable to pinpoint exactly with the ankle hurts. Denies any systemic complaints such as fevers, chills, nausea, vomiting. No acute changes since last appointment, and no other complaints at this time.   Objective: AAO x3, NAD DP/PT pulses palpable bilaterally, CRT less than 3 seconds Semirigid hammertoe contracture to the right second toe and is tenderness at the PIPJ. There is no edema, erythema, increase in warmth. There is tenderness on the plantar aspect of the foot on the medial band plantar fascia just proximal to the sesamoid complex. The plantar fascia appears to be intact. There is mild diffuse tenderness the right ankle but there is no specific area of tenderness. His achilles tendon also appears to be intact. there is no edema, erythema, increase in warmth of the ankle. there is no pain to the deltoid or lateral ankle ligaments. No open lesions or pre-ulcerative lesions.  No pain with calf compression, swelling, warmth, erythema  Assessment: Right second digit tendinitis/chronic fasciitis; hammertoe; chronic ankle pain  Plan: -All treatment options discussed with the patient including all alternatives, risks, complications.   -Regards the hammertoe deformity and I dispensed a toe crest. Also discussed shoe gear modifications. X-ray foot -Also inquiring about a steroid injection. Today discussed risks, complications of a  repeat injection he wishes to proceed. Under standard conditions a mixture of Kenalog and local anesthetic was infiltrated around the area of maximal tenderness the right foot on the plantar fascia proximal to the sesamoids. -Regards to the ankle pain dispensed an ankle brace today as he is requesting this. Also x-ray of the right ankle -He states that he is concerned this may be coming from hip pain all does not seem to correlate. Recommended sports medicine evaluation for hip pain. -Patient encouraged to call the office with any questions, concerns, change in symptoms.   Ovid CurdMatthew Camaron Cammack, DPM

## 2017-08-14 ENCOUNTER — Telehealth: Payer: Self-pay | Admitting: *Deleted

## 2017-08-14 NOTE — Telephone Encounter (Addendum)
-----   Message from Vivi BarrackMatthew R Wagoner, DPM sent at 08/12/2017  7:37 AM EDT ----- X-rays are negative. Val- please let him know. Thank you.08/14/2017-left message informing pt of Dr. Gabriel RungWagoner's review of results.08/17/2017-Left message informing pt of Dr. Gabriel RungWagoner's review of x-ray results.

## 2017-09-22 ENCOUNTER — Ambulatory Visit: Payer: PRIVATE HEALTH INSURANCE | Admitting: Podiatry

## 2017-11-23 ENCOUNTER — Other Ambulatory Visit: Payer: Self-pay | Admitting: Family Medicine

## 2017-11-23 ENCOUNTER — Other Ambulatory Visit: Payer: Self-pay | Admitting: Internal Medicine

## 2017-11-23 DIAGNOSIS — R634 Abnormal weight loss: Secondary | ICD-10-CM

## 2017-11-23 DIAGNOSIS — R911 Solitary pulmonary nodule: Secondary | ICD-10-CM

## 2017-12-03 ENCOUNTER — Other Ambulatory Visit: Payer: Self-pay | Admitting: Internal Medicine

## 2017-12-03 DIAGNOSIS — R634 Abnormal weight loss: Secondary | ICD-10-CM

## 2017-12-07 ENCOUNTER — Ambulatory Visit
Admission: RE | Admit: 2017-12-07 | Discharge: 2017-12-07 | Disposition: A | Discharge status: Home / Self Care | Payer: 59 | Source: Ambulatory Visit | Attending: Internal Medicine | Admitting: Internal Medicine

## 2017-12-07 ENCOUNTER — Ambulatory Visit
Admission: RE | Admit: 2017-12-07 | Discharge: 2017-12-07 | Disposition: A | Discharge status: Home / Self Care | Payer: 59 | Source: Ambulatory Visit | Attending: Family Medicine | Admitting: Family Medicine

## 2017-12-07 DIAGNOSIS — R634 Abnormal weight loss: Secondary | ICD-10-CM

## 2017-12-07 MED ORDER — IOPAMIDOL (ISOVUE-300) INJECTION 61%: 100.0000 mL | Freq: Once | INTRAVENOUS | Status: DC | PRN

## 2018-01-12 ENCOUNTER — Emergency Department (HOSPITAL_BASED_OUTPATIENT_CLINIC_OR_DEPARTMENT_OTHER): Payer: 59

## 2018-01-12 ENCOUNTER — Encounter (HOSPITAL_BASED_OUTPATIENT_CLINIC_OR_DEPARTMENT_OTHER): Payer: Self-pay | Admitting: *Deleted

## 2018-01-12 ENCOUNTER — Emergency Department (HOSPITAL_BASED_OUTPATIENT_CLINIC_OR_DEPARTMENT_OTHER)
Admission: EM | Admit: 2018-01-12 | Discharge: 2018-01-12 | Disposition: A | Discharge status: Home / Self Care | Payer: 59 | Attending: Emergency Medicine | Admitting: Emergency Medicine

## 2018-01-12 ENCOUNTER — Other Ambulatory Visit: Payer: Self-pay

## 2018-01-12 DIAGNOSIS — R51 Headache: Secondary | ICD-10-CM | POA: Diagnosis not present

## 2018-01-12 DIAGNOSIS — F17210 Nicotine dependence, cigarettes, uncomplicated: Secondary | ICD-10-CM | POA: Insufficient documentation

## 2018-01-12 DIAGNOSIS — Z79899 Other long term (current) drug therapy: Secondary | ICD-10-CM | POA: Insufficient documentation

## 2018-01-12 DIAGNOSIS — Z7982 Long term (current) use of aspirin: Secondary | ICD-10-CM | POA: Diagnosis not present

## 2018-01-12 LAB — COMPREHENSIVE METABOLIC PANEL
ALT: 23 U/L (ref 17–63)
AST: 20 U/L (ref 15–41)
Albumin: 4.1 g/dL (ref 3.5–5.0)
Alkaline Phosphatase: 53 U/L (ref 38–126)
Anion gap: 8 (ref 5–15)
BUN: 10 mg/dL (ref 6–20)
CALCIUM: 8.9 mg/dL (ref 8.9–10.3)
CO2: 28 mmol/L (ref 22–32)
Chloride: 103 mmol/L (ref 101–111)
Creatinine, Ser: 0.92 mg/dL (ref 0.61–1.24)
GFR calc Af Amer: >60 mL/min (ref >60)
GFR calc non Af Amer: >60 mL/min (ref >60)
GLUCOSE: 93 mg/dL (ref 65–99)
POTASSIUM: 4.3 mmol/L (ref 3.5–5.1)
SODIUM: 139 mmol/L (ref 135–145)
TOTAL PROTEIN: 6.8 g/dL (ref 6.5–8.1)
Total Bilirubin: 0.4 mg/dL (ref 0.3–1.2)

## 2018-01-12 LAB — URINALYSIS, ROUTINE W REFLEX MICROSCOPIC
BILIRUBIN URINE: NEGATIVE
Glucose, UA: NEGATIVE mg/dL
Hgb urine dipstick: NEGATIVE
KETONES UR: NEGATIVE mg/dL
Leukocytes, UA: NEGATIVE
NITRITE: NEGATIVE
PH: 6 (ref 5.0–8.0)
Protein, ur: NEGATIVE mg/dL
Specific Gravity, Urine: <1.005 — ABNORMAL LOW (ref 1.005–1.030)

## 2018-01-12 LAB — CBC WITH DIFFERENTIAL/PLATELET
BASOS ABS: 0 10*3/uL (ref 0.0–0.1)
Basophils Relative: 0 %
EOS ABS: 0.1 10*3/uL (ref 0.0–0.7)
EOS PCT: 2 %
HCT: 35.5 % — ABNORMAL LOW (ref 39.0–52.0)
Hemoglobin: 12.1 g/dL — ABNORMAL LOW (ref 13.0–17.0)
LYMPHS PCT: 52 %
Lymphs Abs: 3.1 10*3/uL (ref 0.7–4.0)
MCH: 30.3 pg (ref 26.0–34.0)
MCHC: 34.1 g/dL (ref 30.0–36.0)
MCV: 88.8 fL (ref 78.0–100.0)
Monocytes Absolute: 0.5 10*3/uL (ref 0.1–1.0)
Monocytes Relative: 8 %
Neutro Abs: 2.3 10*3/uL (ref 1.7–7.7)
Neutrophils Relative %: 38 %
PLATELETS: 153 10*3/uL (ref 150–400)
RBC: 4 MIL/uL — AB (ref 4.22–5.81)
RDW: 12.2 % (ref 11.5–15.5)
WBC: 6 10*3/uL (ref 4.0–10.5)

## 2018-01-12 LAB — LIPASE, BLOOD: LIPASE: 28 U/L (ref 11–51)

## 2018-01-12 NOTE — ED Notes (Signed)
ED Provider at bedside. 
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
## 2018-01-12 NOTE — Discharge Instructions (Signed)
Your workup today showed no acute traumatic injuries.  Please stay hydrated and use your home pain and muscle relaxant medications.  Please follow-up with your primary care physician in the next several days.  If any symptoms change or worsen, please return to the nearest emergency department.

## 2018-06-24 ENCOUNTER — Encounter: Payer: Self-pay | Admitting: Neurology

## 2018-06-24 ENCOUNTER — Ambulatory Visit (INDEPENDENT_AMBULATORY_CARE_PROVIDER_SITE_OTHER): Payer: PRIVATE HEALTH INSURANCE | Admitting: Neurology

## 2018-06-24 DIAGNOSIS — M79604 Pain in right leg: Secondary | ICD-10-CM

## 2018-06-24 DIAGNOSIS — Z0289 Encounter for other administrative examinations: Secondary | ICD-10-CM

## 2018-06-24 DIAGNOSIS — M79601 Pain in right arm: Secondary | ICD-10-CM | POA: Diagnosis not present

## 2018-06-24 NOTE — Procedures (Signed)
Full Name: Cody Quinn Gender: Male MRN #: [id number] Date of Birth: 2061-09-20    Visit Date: 06/24/18 09:52 Age: 57 Years 0 Months Old Examining Physician: Naomie Dean, MD  Referring Physician: Pearson Grippe, MD    History:  Right leg and right arm pain.  Per physician's records, right leg and foot pain and pain in the right neck and right shoulder has been going on for several months, patient is seeing a chiropractor, pain level 7 out of 10 with Percocet patient is able to go to work and perform his ADLs.  Patient reports he has neck pain and radicular symptoms into the shoulder also low back pain with radicular symptoms into the leg and pain in the foot on the medial side.   (also did extensor longus hallisis and medial muscles of the foot and bottom of the foot.  Summary: EMG/NCS was performed on the right upper and lower extremities. All nerves and muscles were within normal limits.   Conclusion: This is a completely normal exam.  No electrophysiologic evidence for mononeuropathy, polyneuropathy, radiculopathy, neuromuscular disorder.  Naomie Dean, M.D.  Cc: Dr. [facility] [address] [place], [place] [address] Tel: [phone] Fax: [phone]        [facility]    Nerve / Sites Muscle Latency Ref. Amplitude Ref. Rel Amp Segments Distance Velocity Ref. Area    ms ms mV mV %  cm m/s m/s mVms  R Median - APB     Wrist APB 3.5 ?4.4 8.9 ?4.0 100 Wrist - APB 7   34.1     Upper arm APB 7.1  8.9  99.8 Upper arm - Wrist 20 56 ?49 34.5  R Ulnar - ADM     Wrist ADM 2.4 ?3.3 7.6 ?6.0 100 Wrist - ADM 7   24.0     B.Elbow ADM 5.7  7.3  95.7 B.Elbow - Wrist 20 60 ?49 25.9     A.Elbow ADM 7.4  7.1  97.3 A.Elbow - B.Elbow 10 58 ?49 25.6     Axilla ADM 11.3  6.8  95.6 Axilla - A.Elbow 20 53  22.8     Supraclav fossa ADM 12.7  6.9  102 Supraclav fossa - Axilla 8 55  23.7         A.Elbow - Wrist      R Peroneal - EDB     Ankle EDB 5.5 ?6.5 2.9 ?2.0 100 Ankle -  EDB 9   10.2     Fib head EDB 12.4  2.9  97 Fib head - Ankle 31 45 ?44 11.7     Pop fossa EDB 14.7  2.7  96 Pop fossa - Fib head 10 45 ?44 11.4         Pop fossa - Ankle      R Tibial - AH     Ankle AH 4.2 ?5.8 12.0 ?4.0 100 Ankle - AH 9   26.4     Pop fossa AH 12.8  8.3  68.9 Pop fossa - Ankle 37 43 ?41 22.4             SNC    Nerve / Sites Rec. Site Peak Lat Ref.  Amp Ref. Segments Distance Peak Diff Ref.    ms ms V V  cm ms ms  R Radial - Anatomical snuff box (Forearm)     Forearm Wrist 2.6 ?2.9 18 ?15 Forearm - Wrist 10    R  Sural - Ankle (Calf)     Calf Ankle 3.8 ?4.4 8 ?6 Calf - Ankle 14    R Superficial peroneal - Ankle     Lat leg Ankle 4.1 ?4.4 8 ?6 Lat leg - Ankle 14    R Median, Ulnar - Transcarpal comparison     Median Palm Wrist 2.0 ?2.2 73 ?35 Median Palm - Wrist 8       Ulnar Palm Wrist 2.0 ?2.2 15 ?12 Ulnar Palm - Wrist 8          Median Palm - Ulnar Palm  -0.1 ?0.4  R Median - Orthodromic (Dig II, Mid palm)     Dig II Wrist 3.1 ?3.4 17 ?10 Dig II - Wrist 13    R Ulnar - Orthodromic, (Dig V, Mid palm)     Dig V Wrist 2.7 ?3.1 6 ?5 Dig V - Wrist 8011                   F  Wave    Nerve F Lat Ref.   ms ms  R Median - APB 28.4 ?31.0  R Ulnar - ADM 27.7 ?32.0  R Tibial - AH 52.6 ?56.0           EMG full       EMG Summary Table    Spontaneous MUAP Recruitment  Muscle IA Fib PSW Fasc Other Amp Dur. Poly Pattern  R. Cervical paraspinals (low) Normal None None None _______ Normal Normal Normal Normal  R. Deltoid Normal None None None _______ Normal Normal Normal Normal  R. Triceps brachii Normal None None None _______ Normal Normal Normal Normal  R. Pronator teres Normal None None None _______ Normal Normal Normal Normal  R. First dorsal interosseous Normal None None None _______ Normal Normal Normal Normal  R. Opponens pollicis Normal None None None _______ Normal Normal Normal Normal  R. Vastus medialis Normal None None None _______ Normal Normal Normal Normal    R. Tibialis anterior Normal None None None _______ Normal Normal Normal Normal  R. Lumbar paraspinals (low) Normal None None None _______ Normal Normal Normal Normal  R. Gastrocnemius (Medial head) Normal None None None _______ Normal Normal Normal Normal  R. Biceps femoris (long head) Normal None None None _______ Normal Normal Normal Normal  R. Gluteus maximus Normal None None None _______ Normal Normal Normal Normal  R. Gluteus medius Normal None None None _______ Normal Normal Normal Normal  R. Supraspinatus Normal None None None _______ Normal Normal Normal Normal  R. Infraspinatus Normal None None None _______ Normal Normal Normal Normal  R. Extensor hallucis Normal None None None _______ Normal Normal Normal Normal  R. Abductor hallucis Normal None None None _______ Normal Normal Normal Normal  R. Flexor Hallucis Brevis Normal None None None _______ Normal Normal Normal Normal  R. Trapezius Normal None None None _______ Normal Normal Normal Normal

## 2018-06-24 NOTE — Progress Notes (Signed)
Full Name: Cody Quinn Gender: Male MRN #: [id number] Date of Birth: 26-Jan-2061    Visit Date: 06/24/18 09:52 Age: 57 Years 0 Months Old Examining Physician: Naomie Dean, MD  Referring Physician: Pearson Grippe, MD    History:  Right leg and right arm pain.  Per physician's records, right leg and foot pain and pain in the right neck and right shoulder has been going on for several months, patient is seeing a chiropractor, pain level 7 out of 10 with Percocet patient is able to go to work and perform his ADLs.  Patient reports he has neck pain and radicular symptoms into the shoulder also low back pain with radicular symptoms into the leg and pain in the foot on the medial side.   (also did extensor longus hallisis and medial muscles of the foot and bottom of the foot.  Summary: EMG/NCS was performed on the right upper and lower extremities. All nerves and muscles were within normal limits.   Conclusion: This is a completely normal exam.  No electrophysiologic evidence for mononeuropathy, polyneuropathy, radiculopathy, neuromuscular disorder.  Naomie Dean, M.D.  Cc: Dr. Lor[facility] [address] [place], [place] [address] Tel: [phone] Fax: [phone]        [facility]    Nerve / Sites Muscle Latency Ref. Amplitude Ref. Rel Amp Segments Distance Velocity Ref. Area    ms ms mV mV %  cm m/s m/s mVms  R Median - APB     Wrist APB 3.5 ?4.4 8.9 ?4.0 100 Wrist - APB 7   34.1     Upper arm APB 7.1  8.9  99.8 Upper arm - Wrist 20 56 ?49 34.5  R Ulnar - ADM     Wrist ADM 2.4 ?3.3 7.6 ?6.0 100 Wrist - ADM 7   24.0     B.Elbow ADM 5.7  7.3  95.7 B.Elbow - Wrist 20 60 ?49 25.9     A.Elbow ADM 7.4  7.1  97.3 A.Elbow - B.Elbow 10 58 ?49 25.6     Axilla ADM 11.3  6.8  95.6 Axilla - A.Elbow 20 53  22.8     Supraclav fossa ADM 12.7  6.9  102 Supraclav fossa - Axilla 8 55  23.7         A.Elbow - Wrist      R Peroneal - EDB     Ankle EDB 5.5 ?6.5 2.9 ?2.0 100 Ankle -  EDB 9   10.2     Fib head EDB 12.4  2.9  97 Fib head - Ankle 31 45 ?44 11.7     Pop fossa EDB 14.7  2.7  96 Pop fossa - Fib head 10 45 ?44 11.4         Pop fossa - Ankle      R Tibial - AH     Ankle AH 4.2 ?5.8 12.0 ?4.0 100 Ankle - AH 9   26.4     Pop fossa AH 12.8  8.3  68.9 Pop fossa - Ankle 37 43 ?41 22.4             SNC    Nerve / Sites Rec. Site Peak Lat Ref.  Amp Ref. Segments Distance Peak Diff Ref.    ms ms V V  cm ms ms  R Radial - Anatomical snuff box (Forearm)     Forearm Wrist 2.6 ?2.9 18 ?15 Forearm - Wrist 10    R  Sural - Ankle (Calf)     Calf Ankle 3.8 ?4.4 8 ?6 Calf - Ankle 14    R Superficial peroneal - Ankle     Lat leg Ankle 4.1 ?4.4 8 ?6 Lat leg - Ankle 14    R Median, Ulnar - Transcarpal comparison     Median Palm Wrist 2.0 ?2.2 73 ?35 Median Palm - Wrist 8       Ulnar Palm Wrist 2.0 ?2.2 15 ?12 Ulnar Palm - Wrist 8          Median Palm - Ulnar Palm  -0.1 ?0.4  R Median - Orthodromic (Dig II, Mid palm)     Dig II Wrist 3.1 ?3.4 17 ?10 Dig II - Wrist 13    R Ulnar - Orthodromic, (Dig V, Mid palm)     Dig V Wrist 2.7 ?3.1 6 ?5 Dig V - Wrist 311                   F  Wave    Nerve F Lat Ref.   ms ms  R Median - APB 28.4 ?31.0  R Ulnar - ADM 27.7 ?32.0  R Tibial - AH 52.6 ?56.0           EMG full       EMG Summary Table    Spontaneous MUAP Recruitment  Muscle IA Fib PSW Fasc Other Amp Dur. Poly Pattern  R. Cervical paraspinals (low) Normal None None None _______ Normal Normal Normal Normal  R. Deltoid Normal None None None _______ Normal Normal Normal Normal  R. Triceps brachii Normal None None None _______ Normal Normal Normal Normal  R. Pronator teres Normal None None None _______ Normal Normal Normal Normal  R. First dorsal interosseous Normal None None None _______ Normal Normal Normal Normal  R. Opponens pollicis Normal None None None _______ Normal Normal Normal Normal  R. Vastus medialis Normal None None None _______ Normal Normal Normal Normal    R. Tibialis anterior Normal None None None _______ Normal Normal Normal Normal  R. Lumbar paraspinals (low) Normal None None None _______ Normal Normal Normal Normal  R. Gastrocnemius (Medial head) Normal None None None _______ Normal Normal Normal Normal  R. Biceps femoris (long head) Normal None None None _______ Normal Normal Normal Normal  R. Gluteus maximus Normal None None None _______ Normal Normal Normal Normal  R. Gluteus medius Normal None None None _______ Normal Normal Normal Normal  R. Supraspinatus Normal None None None _______ Normal Normal Normal Normal  R. Infraspinatus Normal None None None _______ Normal Normal Normal Normal  R. Extensor Hallucis Normal None None None _______ Normal Normal Normal Normal  R. Abductor Hallucis Normal None None None _______ Normal Normal Normal Normal  R. Flexor Hallucis Brevis Normal None None None _______ Normal Normal Normal Normal  R. Trapezius Normal None None None _______ Normal Normal Normal Normal

## 2019-01-03 IMAGING — DX DG FOOT 2V*R*
2 series · 2 of 2 positions shown · non-contrast
Comparison: 01/20/2017

CLINICAL DATA: Right foot pain. Prior history of joint replacement
surgery involving the great toe.

EXAM:
RIGHT FOOT - 2 VIEW

[foot ap]
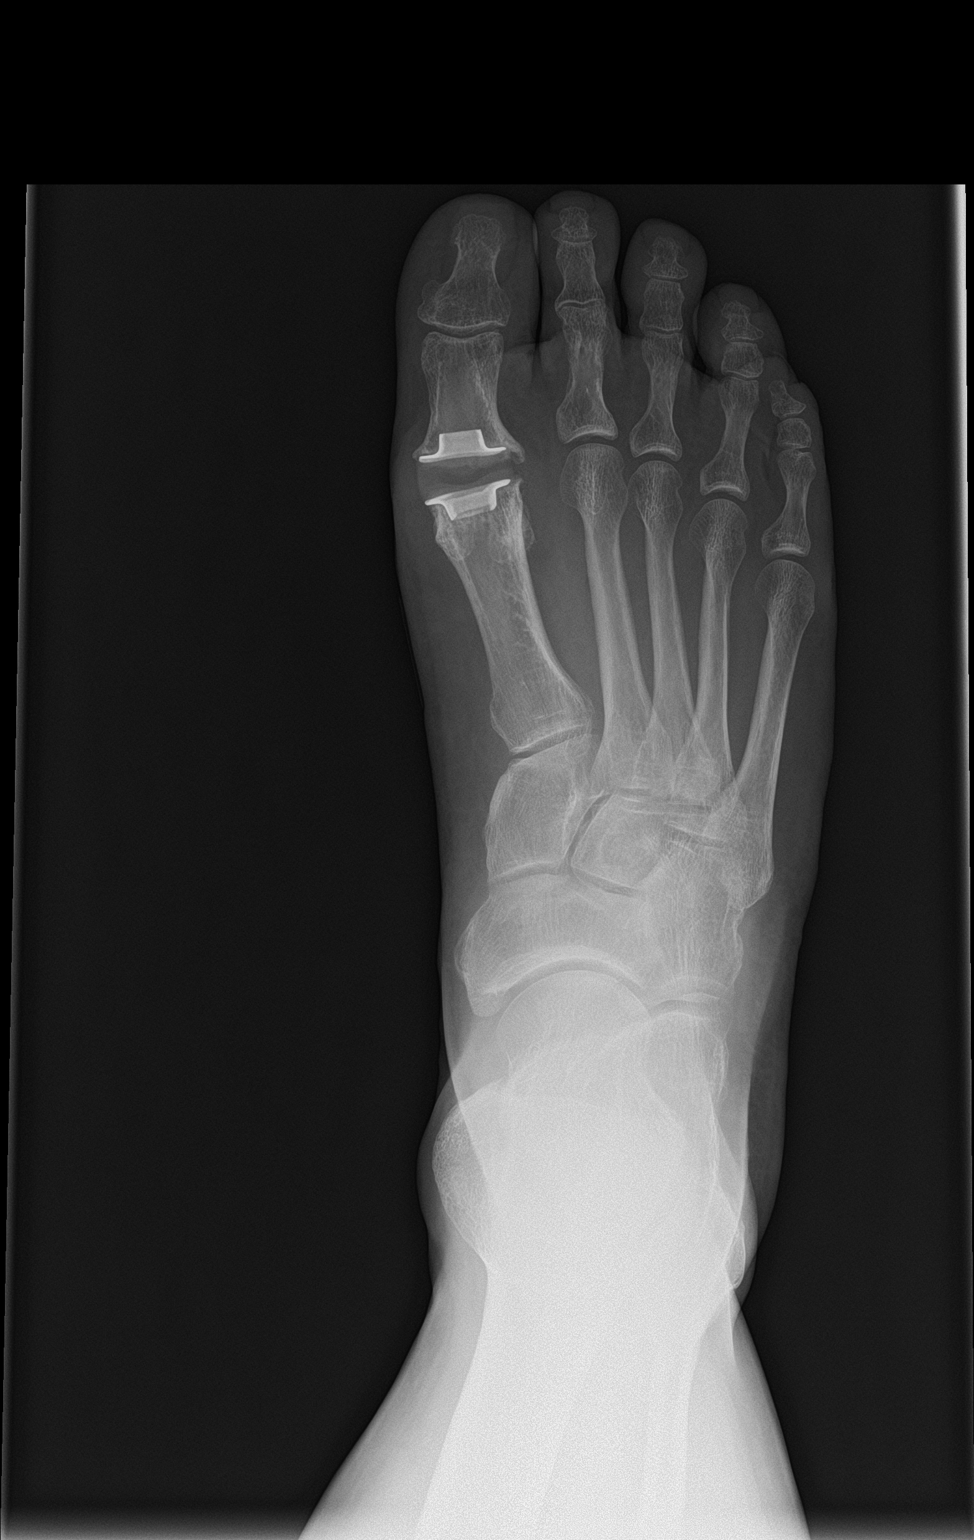

[foot lat]
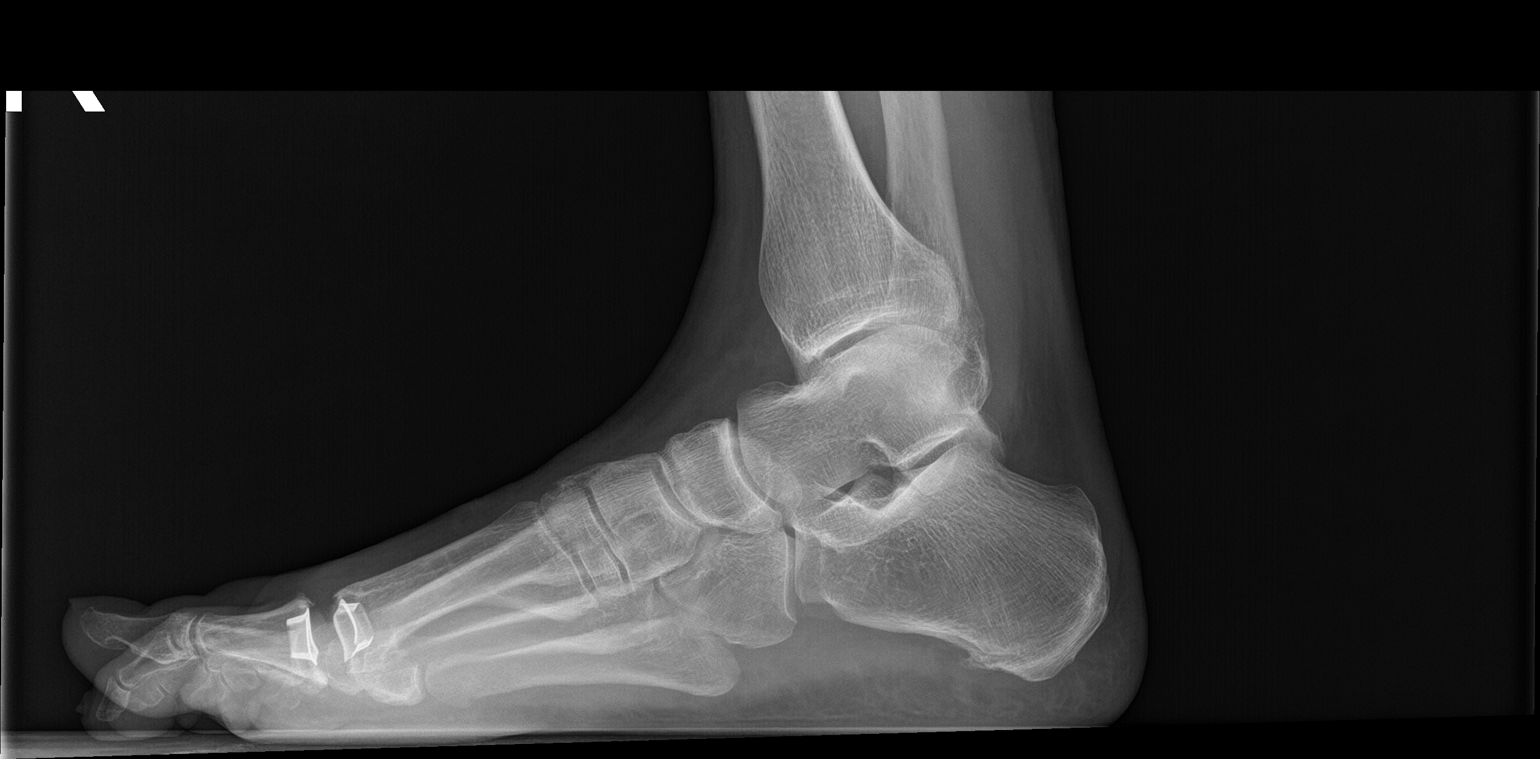

[2 of 2 positions shown; findings below may reference images not displayed]

FINDINGS: Stable well seated components of a joint replacement involving the
first MTP joint. No complicating features are demonstrated. The
other joint spaces are fairly well maintained. No midfoot or
hindfoot bony abnormalities. Tiny calcaneal heel spur.
IMPRESSION: No acute bony findings. Stable appearance of the first MTP joint
arthroplasty.

## 2020-04-25 ENCOUNTER — Other Ambulatory Visit: Payer: Self-pay | Admitting: Orthopedic Surgery

## 2020-04-25 DIAGNOSIS — S62162A Displaced fracture of pisiform, left wrist, initial encounter for closed fracture: Secondary | ICD-10-CM

## 2020-05-08 ENCOUNTER — Ambulatory Visit
Admission: RE | Admit: 2020-05-08 | Discharge: 2020-05-08 | Disposition: A | Discharge status: Home / Self Care | Payer: Worker's Compensation | Source: Ambulatory Visit | Attending: Orthopedic Surgery | Admitting: Orthopedic Surgery

## 2020-05-08 DIAGNOSIS — S62162A Displaced fracture of pisiform, left wrist, initial encounter for closed fracture: Secondary | ICD-10-CM

## 2021-06-25 ENCOUNTER — Other Ambulatory Visit: Payer: Self-pay | Admitting: Family Medicine

## 2021-06-25 DIAGNOSIS — G8929 Other chronic pain: Secondary | ICD-10-CM

## 2021-06-25 DIAGNOSIS — M519 Unspecified thoracic, thoracolumbar and lumbosacral intervertebral disc disorder: Secondary | ICD-10-CM

## 2021-06-25 DIAGNOSIS — M549 Dorsalgia, unspecified: Secondary | ICD-10-CM

## 2021-07-20 ENCOUNTER — Other Ambulatory Visit: Payer: Self-pay
